# Patient Record
Sex: Male | Born: 1995 | Marital: Single | Smoking: Current every day smoker
Health system: Southern US, Community
[De-identification: ages and names within clinical notes are randomized; demographics above are authoritative.]

## PROBLEM LIST (undated history)

## (undated) DIAGNOSIS — K259 Gastric ulcer, unspecified as acute or chronic, without hemorrhage or perforation: Secondary | ICD-10-CM

## (undated) DIAGNOSIS — J45909 Unspecified asthma, uncomplicated: Secondary | ICD-10-CM

## (undated) DIAGNOSIS — Z5189 Encounter for other specified aftercare: Secondary | ICD-10-CM

## (undated) HISTORY — PX: HERNIA REPAIR: SHX51

---
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
## 2016-03-02 ENCOUNTER — Emergency Department (HOSPITAL_COMMUNITY): Payer: Medicaid Other

## 2016-03-02 ENCOUNTER — Emergency Department (HOSPITAL_COMMUNITY)
Admission: EM | Admit: 2016-03-02 | Discharge: 2016-03-02 | Disposition: A | Discharge status: Home / Self Care | Payer: Medicaid Other | Attending: Emergency Medicine | Admitting: Emergency Medicine

## 2016-03-02 ENCOUNTER — Encounter (HOSPITAL_COMMUNITY): Payer: Self-pay | Admitting: *Deleted

## 2016-03-02 DIAGNOSIS — Y9351 Activity, roller skating (inline) and skateboarding: Secondary | ICD-10-CM | POA: Insufficient documentation

## 2016-03-02 DIAGNOSIS — S01511A Laceration without foreign body of lip, initial encounter: Secondary | ICD-10-CM | POA: Insufficient documentation

## 2016-03-02 DIAGNOSIS — Y9289 Other specified places as the place of occurrence of the external cause: Secondary | ICD-10-CM | POA: Insufficient documentation

## 2016-03-02 DIAGNOSIS — S01512A Laceration without foreign body of oral cavity, initial encounter: Secondary | ICD-10-CM

## 2016-03-02 DIAGNOSIS — F17210 Nicotine dependence, cigarettes, uncomplicated: Secondary | ICD-10-CM | POA: Insufficient documentation

## 2016-03-02 DIAGNOSIS — K08419 Partial loss of teeth due to trauma, unspecified class: Secondary | ICD-10-CM

## 2016-03-02 DIAGNOSIS — Z23 Encounter for immunization: Secondary | ICD-10-CM | POA: Insufficient documentation

## 2016-03-02 DIAGNOSIS — W19XXXA Unspecified fall, initial encounter: Secondary | ICD-10-CM

## 2016-03-02 DIAGNOSIS — Y999 Unspecified external cause status: Secondary | ICD-10-CM | POA: Insufficient documentation

## 2016-03-02 DIAGNOSIS — S161XXA Strain of muscle, fascia and tendon at neck level, initial encounter: Secondary | ICD-10-CM | POA: Insufficient documentation

## 2016-03-02 DIAGNOSIS — K08409 Partial loss of teeth, unspecified cause, unspecified class: Secondary | ICD-10-CM | POA: Insufficient documentation

## 2016-03-02 DIAGNOSIS — S0181XA Laceration without foreign body of other part of head, initial encounter: Secondary | ICD-10-CM

## 2016-03-02 DIAGNOSIS — J45909 Unspecified asthma, uncomplicated: Secondary | ICD-10-CM | POA: Insufficient documentation

## 2016-03-02 MED ORDER — HYDROCODONE-ACETAMINOPHEN 5-325 MG PO TABS: 1.0000 | ORAL_TABLET | Freq: Four times a day (QID) | ORAL | 0 refills | Status: AC | PRN

## 2016-03-02 MED ORDER — HYDROCODONE-ACETAMINOPHEN 5-325 MG PO TABS
1.0000 | ORAL_TABLET | Freq: Once | ORAL | Status: AC
  Administered 2016-03-02: 1 via ORAL
  Filled 2016-03-02: qty 1

## 2016-03-02 MED ORDER — TETANUS-DIPHTH-ACELL PERTUSSIS 5-2.5-18.5 LF-MCG/0.5 IM SUSP
0.5000 mL | Freq: Once | INTRAMUSCULAR | Status: AC
  Administered 2016-03-02: 0.5 mL via INTRAMUSCULAR
  Filled 2016-03-02: qty 0.5

## 2016-03-02 MED ORDER — LIDOCAINE HCL (PF) 1 % IJ SOLN
5.0000 mL | Freq: Once | INTRAMUSCULAR | Status: AC
  Administered 2016-03-02: 5 mL
  Filled 2016-03-02: qty 5

## 2016-03-02 NOTE — ED Triage Notes (Signed)
Pt reports R front tooth being knocked out today after skateboarding accident, pt has two 1 cm lacs to upper lip, ambulatory, denies LOC

## 2016-03-02 NOTE — Discharge Instructions (Signed)
Call Dr. Leanord AsalFarless in the morning and schedule follow up. Do not drive while taking the narcotic as it will make you sleepy. Continue to take the ibuprofen.  The sutures inside the mouth will dissolve. You will need to have the sutures taken out of the outside of the lip in 3 to 5 days. Return sooner for any problems.

## 2016-03-02 NOTE — ED Provider Notes (Signed)
[facility]-EMERGENCY DEPT Provider Note   CSN: [id number] Arrival date & time: 03/02/16  1620  By signing my name below, I, Nathan Roberson, attest that this documentation has been prepared under the direction and in the presence of Kerrie BuffaloHope Aquilla Shambley, NP. Electronically Signed: Alyssa GroveMartin Roberson, ED Scribe. 03/02/16. 5:28 PM.  History   Chief Complaint Chief Complaint  Patient presents with  . Facial Injury   The history is provided by the patient. No language interpreter was used.    HPI Comments: Nathan Roberson is a 21 y.o. male who presents to the Emergency Department complaining of a dental injury and facial laceration s/p skateboarding accident at 3 PM. He attempted to jump down a set of stairs and fell, striking his face on the skateboard and stair railing. Pt is missing a right sided incisor. He denies LOC. Pt reports associated mild neck pain. He has tried 800 mg Ibuprofen with no significant relief. He is unaware of his last tetanus vaccination. Denies nosebleeds, visual changes, bleeding from ears, or any other complaints at this time.   Past Medical History:  Diagnosis Date  . Asthma   . Gastric ulcer     There are no active problems to display for this patient.   Past Surgical History:  Procedure Laterality Date  . HERNIA REPAIR         Home Medications    Prior to Admission medications   Medication Sig Start Date End Date Taking? Authorizing Provider  HYDROcodone-acetaminophen (NORCO) 5-325 MG tablet Take 1 tablet by mouth every 6 (six) hours as needed. 03/02/16   Donelle Hise Orlene OchM Melquan Ernsberger, NP  traMADol (ULTRAM) 50 MG tablet Take 1-2 tablets (50-100 mg total) by mouth every 6 (six) hours as needed. 04/25/14   Renne CriglerJoshua Geiple, PA-C    Family History No family history on file.  Social History Social History  Substance Use Topics  . Smoking status: Current Every Day Smoker    Packs/day: 0.75    Types: Cigarettes  . Smokeless tobacco: Never Used  . Alcohol use No     Comment: OCCASSIONAL      Allergies   Pineapple   Review of Systems Review of Systems  HENT: Positive for dental problem. Negative for ear discharge and nosebleeds.        +Facial Lacerations  Eyes: Negative for visual disturbance.  Gastrointestinal: Negative for nausea and vomiting.  Musculoskeletal: Positive for neck pain. Negative for back pain.  Skin: Positive for wound.  Neurological: Negative for syncope and headaches.  Psychiatric/Behavioral: Negative for confusion.     Physical Exam Updated Vital Signs BP (!) 112/46 (BP Location: Right Arm)   Pulse 73   Temp 99.2 F (37.3 C) (Oral)   Resp 17   Ht 5\' 7"  (1.702 m)   Wt 76.2 kg   SpO2 99%   BMI 26.31 kg/m   Physical Exam  Constitutional: He is oriented to person, place, and time. He appears well-developed and well-nourished. He is active. No distress.  HENT:  Head: Head is with laceration.    Right Ear: Tympanic membrane normal.  Left Ear: Tympanic membrane normal.  Uvula is midline, no edema or erythema There are 2 lacerations to the upper lip which do not involve the vermillion border Each laceration is 1 cm Pt is missing the right upper central incisor   Eyes: Conjunctivae and EOM are normal. Pupils are equal, round, and reactive to light. No scleral icterus.  Neck: Trachea normal. Spinous process tenderness and muscular tenderness (left) present.  Decreased range of motion: due to pain.  Tenderness over the cervical spine  Cardiovascular: Normal rate and regular rhythm.   Pulmonary/Chest: Effort normal. No respiratory distress.  Musculoskeletal: Normal range of motion. He exhibits tenderness.  Neurological: He is alert and oriented to person, place, and time.  Skin: Skin is warm and dry.  Psychiatric: He has a normal mood and affect. His behavior is normal.  Nursing note and vitals reviewed.   ED Treatments / Results  DIAGNOSTIC STUDIES: Oxygen Saturation is 99% on RA, normal by my interpretation.    COORDINATION OF  CARE: 5:20 PM Discussed treatment plan with pt at bedside which includes Tdap injection and CT Cervical Spine and pt agreed to plan.  Labs (all labs ordered are listed, but only abnormal results are displayed) Labs Reviewed - No data to display   Radiology Ct Cervical Spine Wo Contrast  Result Date: 03/02/2016 CLINICAL DATA:  21 y/o  M; neck pain after fall. EXAM: CT CERVICAL SPINE WITHOUT CONTRAST TECHNIQUE: Multidetector CT imaging of the cervical spine was performed without intravenous contrast. Multiplanar CT image reconstructions were also generated. COMPARISON:  None. FINDINGS: Alignment: Normal. Skull base and vertebrae: No acute fracture. No primary bone lesion or focal pathologic process. Soft tissues and spinal canal: No prevertebral fluid or swelling. No visible canal hematoma. Disc levels:  No significant cervical degenerative changes. Upper chest: Negative. Other: Thin sigmoid plate of right jugular bulb, borderline high-riding. IMPRESSION: No acute fracture or dislocation.  Normal CT of the cervical spine. Electronically Signed   By: Mitzi Hansen M.D.   On: 03/02/2016 18:16    Procedures .Marland KitchenLaceration Repair Date/Time: 03/02/2016 6:33 PM Performed by: Janne Napoleon Authorized by: Janne Napoleon   Consent:    Consent obtained:  Verbal   Consent given by:  Patient   Risks discussed:  Pain, infection and poor cosmetic result   Alternatives discussed:  No treatment Anesthesia (see MAR for exact dosages):    Anesthesia method:  Local infiltration   Local anesthetic:  Lidocaine 1% w/o epi Laceration details:    Location:  Lip   Lip location:  Upper exterior lip   Length (cm):  1 Repair type:    Repair type:  Simple Pre-procedure details:    Preparation:  Patient was prepped and draped in usual sterile fashion Exploration:    Wound exploration: entire depth of wound probed and visualized     Contaminated: no   Treatment:    Area cleansed with:  Saline   Amount  of cleaning:  Standard   Irrigation solution:  Sterile saline   Irrigation method:  Syringe Skin repair:    Repair method:  Sutures   Suture size:  6-0   Suture material:  Prolene   Suture technique:  Simple interrupted   Number of sutures:  2 Approximation:    Approximation:  Close Post-procedure details:    Dressing:  Open (no dressing)   Patient tolerance of procedure:  Tolerated well, no immediate complications Comments:     This was a through and through laceration of the upper lip.  Inside the upper lip wound closed with 6-0 Vicryl  X 3 sutures.    (including critical care time)  Medications Ordered in ED Medications  Tdap (BOOSTRIX) injection 0.5 mL (0.5 mLs Intramuscular Given 03/02/16 1734)  lidocaine (PF) (XYLOCAINE) 1 % injection 5 mL (5 mLs Infiltration Given by Other 03/02/16 1735)  HYDROcodone-acetaminophen (NORCO/VICODIN) 5-325 MG per tablet 1 tablet (1 tablet Oral Given  03/02/16 1903)     Initial Impression / Assessment and Plan / ED Course  I have reviewed the triage vital signs and the nursing notes.  Pertinent labs & imaging results that were available during my care of the patient were reviewed by me and considered in my medical decision making (see chart for details).    I personally performed the services described in this documentation, which was scribed in my presence. The recorded information has been reviewed and is accurate.   Final Clinical Impressions(s) / ED Diagnoses  21 y.o. male with dental injury and facial wounds s/p fall stable for d/c with normal CT of neck. Patient given referral to dentist. Discussed wound care and suture removal in 3 to 5 days . Patient will return here sooner for any problems.  Final diagnoses:  Facial laceration, initial encounter  Laceration of mouth, initial encounter  Cervical strain, acute, initial encounter  Knocked out tooth, unspecified edentulism class  Fall, initial encounter    New  Prescriptions Discharge Medication List as of 03/02/2016  7:03 PM    START taking these medications   Details  HYDROcodone-acetaminophen (NORCO) 5-325 MG tablet Take 1 tablet by mouth every 6 (six) hours as needed., Starting Wed 03/02/2016, Print         Severy, NP 03/03/16 1610    Arby Barrette, MD 03/03/16 [phone]

## 2016-03-02 NOTE — ED Notes (Signed)
Pt stable, ambulatory, states understanding of discharge instructions 

## 2016-03-09 ENCOUNTER — Emergency Department (HOSPITAL_COMMUNITY)
Admission: EM | Admit: 2016-03-09 | Discharge: 2016-03-09 | Disposition: A | Discharge status: Home / Self Care | Payer: Medicaid Other | Attending: Emergency Medicine | Admitting: Emergency Medicine

## 2016-03-09 ENCOUNTER — Encounter (HOSPITAL_COMMUNITY): Payer: Self-pay | Admitting: *Deleted

## 2016-03-09 DIAGNOSIS — Z4802 Encounter for removal of sutures: Secondary | ICD-10-CM | POA: Insufficient documentation

## 2016-03-09 DIAGNOSIS — F17210 Nicotine dependence, cigarettes, uncomplicated: Secondary | ICD-10-CM | POA: Insufficient documentation

## 2016-03-09 DIAGNOSIS — Z79899 Other long term (current) drug therapy: Secondary | ICD-10-CM | POA: Insufficient documentation

## 2016-03-09 DIAGNOSIS — J45909 Unspecified asthma, uncomplicated: Secondary | ICD-10-CM | POA: Insufficient documentation

## 2016-03-09 NOTE — ED Provider Notes (Signed)
[facility]-EMERGENCY DEPT Provider Note   CSN: [id number] Arrival date & time: 03/09/16  2104  By signing my name below, I, Nathan Roberson, attest that this documentation has been prepared under the direction and in the presence of Nathan Roberson, [place]. Electronically Signed: Teofilo PodMatthew P. Roberson, ED Scribe. 03/09/2016. 10:24 PM.    History   Chief Complaint Chief Complaint  Patient presents with  . Suture / Staple Removal    The history is provided by the patient. No language interpreter was used.   HPI Comments:  Nathan Roberson is a 21 y.o. male who presents to the Emergency Department, here for a suture removal after having 2 sutures placed to his upper lip 7 days ago. Pt was seen here on 03/02/16 after sustaining a laceration to his upper lip after striking his upper lip on a skateboard and a railing. Pt denies any drainage or sign of infection. No alleviating factors noted. Pt denies other associated symptoms.   Past Medical History:  Diagnosis Date  . Asthma   . Gastric ulcer     There are no active problems to display for this patient.   Past Surgical History:  Procedure Laterality Date  . HERNIA REPAIR         Home Medications    Prior to Admission medications   Medication Sig Start Date End Date Taking? Authorizing Provider  HYDROcodone-acetaminophen (NORCO) 5-325 MG tablet Take 1 tablet by mouth every 6 (six) hours as needed. 03/02/16   Hope Orlene OchM Neese, NP  traMADol (ULTRAM) 50 MG tablet Take 1-2 tablets (50-100 mg total) by mouth every 6 (six) hours as needed. 04/25/14   Renne CriglerJoshua Geiple, PA-C    Family History No family history on file.  Social History Social History  Substance Use Topics  . Smoking status: Current Every Day Smoker    Packs/day: 0.75    Types: Cigarettes  . Smokeless tobacco: Never Used  . Alcohol use Yes     Comment: OCCASSIONAL     Allergies   Pineapple   Review of Systems Review of Systems  Constitutional: Negative for chills  and fever.  HENT: Negative for ear pain and sore throat.   Eyes: Negative for pain and visual disturbance.  Respiratory: Negative for cough and shortness of breath.   Cardiovascular: Negative for chest pain and palpitations.  Gastrointestinal: Negative for abdominal pain and vomiting.  Genitourinary: Negative for dysuria and hematuria.  Musculoskeletal: Negative for arthralgias and back pain.  Skin: Positive for wound. Negative for color change and rash.  Neurological: Negative for seizures and syncope.     Physical Exam Updated Vital Signs BP (!) 136/52 (BP Location: Right Arm)   Pulse 65   Temp 98.5 F (36.9 C) (Oral)   Resp 18   SpO2 99%   Physical Exam  Constitutional: He appears well-developed and well-nourished. No distress.  Patient is afebrile, non-toxic appearing, seating comfortably in chair in no acute distress.   HENT:  Head: Normocephalic and atraumatic.  Eyes: Conjunctivae are normal.  Cardiovascular: Normal rate.   Pulmonary/Chest: Effort normal.  Abdominal: He exhibits no distension.  Neurological: He is alert.  Skin: Skin is warm and dry.  Psychiatric: He has a normal mood and affect.  Nursing note and vitals reviewed.    ED Treatments / Results  DIAGNOSTIC STUDIES:  Oxygen Saturation is 96% on RA, normal by my interpretation.    COORDINATION OF CARE:  10:24 PM Will remove sutures. Discussed treatment plan with pt at bedside and  pt agreed to plan.   Labs (all labs ordered are listed, but only abnormal results are displayed) Labs Reviewed - No data to display  EKG  EKG Interpretation None       Radiology No results found.  Procedures Procedures (including critical care time)  SUTURE REMOVAL Performed by: Georgiana Shore  Consent: Verbal consent obtained. Patient identity confirmed: provided demographic data Time out: Immediately prior to procedure a "time out" was called to verify the correct patient, procedure, equipment,  support staff and site/side marked as required.  Location details: above upper lip  Wound Appearance: clean  Sutures/Staples Removed: 2  Facility: sutures placed in this facility Patient tolerance: Patient tolerated the procedure well with no immediate complications.    Medications Ordered in ED Medications - No data to display   Initial Impression / Assessment and Plan / ED Course  I have reviewed the triage vital signs and the nursing notes.  Pertinent labs & imaging results that were available during my care of the patient were reviewed by me and considered in my medical decision making (see chart for details).     Otherwise healthy 21 year old male presenting for suture removal. 2 sutures just above the upper lip placed 7 days ago. Patient was advised to return in 3-5 days later to have them removed. He denies any complications. Well-healing lacerations, no erythema, swelling, or purulent discharge. He is otherwise well-appearing, nontoxic and afebrile.  Sutures were removed without any complications. Discharge home with PCP and dentist follow-up. He had been told to be seen by a dentist at his last visit and hasn't made it because he didn't have a ride. Urged him to follow-up and provided him with resources in the area for a dentist and her primary care provider. States that he will be making an appointment and be seen.  Discussed strict return precautions. Patient was advised to return to the emergency department if experiencing any new or worsening symptoms. Patient clearly understood instructions and agreed with discharge plan.  Final Clinical Impressions(s) / ED Diagnoses   Final diagnoses:  Visit for suture removal    New Prescriptions New Prescriptions   No medications on file  I personally performed the services described in this documentation, which was scribed in my presence. The recorded information has been reviewed and is accurate.     Georgiana Shore, PA-C 03/09/16 2255    Marily Memos, MD 03/09/16 [phone]

## 2016-03-09 NOTE — ED Triage Notes (Signed)
Pt had sutures placed in top lip 5 days ago, reports some sutures were dissolvable. Requesting removal of remaining stitches
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
## 2016-08-06 ENCOUNTER — Inpatient Hospital Stay (HOSPITAL_COMMUNITY)
Admission: EM | Admit: 2016-08-06 | Discharge: 2016-08-07 | DRG: 605 | Disposition: A | Discharge status: Home / Self Care | Payer: Self-pay | Attending: Surgery | Admitting: Surgery

## 2016-08-06 ENCOUNTER — Emergency Department (HOSPITAL_COMMUNITY): Payer: Self-pay

## 2016-08-06 ENCOUNTER — Encounter (HOSPITAL_COMMUNITY): Payer: Self-pay

## 2016-08-06 DIAGNOSIS — F1010 Alcohol abuse, uncomplicated: Secondary | ICD-10-CM | POA: Diagnosis present

## 2016-08-06 DIAGNOSIS — S31119A Laceration without foreign body of abdominal wall, unspecified quadrant without penetration into peritoneal cavity, initial encounter: Secondary | ICD-10-CM

## 2016-08-06 DIAGNOSIS — Z7289 Other problems related to lifestyle: Secondary | ICD-10-CM

## 2016-08-06 DIAGNOSIS — T1491XA Suicide attempt, initial encounter: Secondary | ICD-10-CM | POA: Diagnosis present

## 2016-08-06 DIAGNOSIS — S31112A Laceration without foreign body of abdominal wall, epigastric region without penetration into peritoneal cavity, initial encounter: Principal | ICD-10-CM | POA: Diagnosis present

## 2016-08-06 DIAGNOSIS — S51812A Laceration without foreign body of left forearm, initial encounter: Secondary | ICD-10-CM | POA: Diagnosis present

## 2016-08-06 DIAGNOSIS — F4325 Adjustment disorder with mixed disturbance of emotions and conduct: Secondary | ICD-10-CM | POA: Diagnosis present

## 2016-08-06 DIAGNOSIS — F1910 Other psychoactive substance abuse, uncomplicated: Secondary | ICD-10-CM

## 2016-08-06 DIAGNOSIS — F329 Major depressive disorder, single episode, unspecified: Secondary | ICD-10-CM | POA: Diagnosis present

## 2016-08-06 DIAGNOSIS — IMO0002 Reserved for concepts with insufficient information to code with codable children: Secondary | ICD-10-CM

## 2016-08-06 DIAGNOSIS — F4310 Post-traumatic stress disorder, unspecified: Secondary | ICD-10-CM | POA: Diagnosis present

## 2016-08-06 DIAGNOSIS — X781XXA Intentional self-harm by knife, initial encounter: Secondary | ICD-10-CM | POA: Diagnosis present

## 2016-08-06 HISTORY — DX: Encounter for other specified aftercare: Z51.89

## 2016-08-06 LAB — PREPARE FRESH FROZEN PLASMA
UNIT DIVISION: 0
Unit division: 0

## 2016-08-06 LAB — I-STAT CG4 LACTIC ACID, ED: Lactic Acid, Venous: 2.21 mmol/L (ref 0.5–1.9)

## 2016-08-06 LAB — TYPE AND SCREEN
ABO/RH(D): O POS
Antibody Screen: NEGATIVE
Unit division: 0
Unit division: 0

## 2016-08-06 LAB — I-STAT CHEM 8, ED
BUN: 20 mg/dL (ref 6–20)
Calcium, Ion: 1 mmol/L — ABNORMAL LOW (ref 1.15–1.40)
Chloride: 104 mmol/L (ref 101–111)
Creatinine, Ser: 1.2 mg/dL (ref 0.61–1.24)
Glucose, Bld: 102 mg/dL — ABNORMAL HIGH (ref 65–99)
HCT: 42 % (ref 39.0–52.0)
Hemoglobin: 14.3 g/dL (ref 13.0–17.0)
Potassium: 3.5 mmol/L (ref 3.5–5.1)
Sodium: 139 mmol/L (ref 135–145)
TCO2: 23 mmol/L (ref 0–100)

## 2016-08-06 LAB — BPAM FFP
BLOOD PRODUCT EXPIRATION DATE: 201808012359
Blood Product Expiration Date: 201808012359
ISSUE DATE / TIME: 201807280312
ISSUE DATE / TIME: 201807280312
Unit Type and Rh: 6200
Unit Type and Rh: 6200

## 2016-08-06 LAB — CBC
HCT: 39.9 % (ref 39.0–52.0)
Hemoglobin: 14.2 g/dL (ref 13.0–17.0)
MCH: 29.9 pg (ref 26.0–34.0)
MCHC: 35.6 g/dL (ref 30.0–36.0)
MCV: 84 fL (ref 78.0–100.0)
Platelets: 265 10*3/uL (ref 150–400)
RBC: 4.75 MIL/uL (ref 4.22–5.81)
RDW: 11.8 % (ref 11.5–15.5)
WBC: 9.2 10*3/uL (ref 4.0–10.5)

## 2016-08-06 LAB — URINALYSIS, ROUTINE W REFLEX MICROSCOPIC
Bilirubin Urine: NEGATIVE
GLUCOSE, UA: NEGATIVE mg/dL
Hgb urine dipstick: NEGATIVE
Ketones, ur: NEGATIVE mg/dL
LEUKOCYTES UA: NEGATIVE
Nitrite: NEGATIVE
PROTEIN: NEGATIVE mg/dL
Specific Gravity, Urine: 1.042 — ABNORMAL HIGH (ref 1.005–1.030)
pH: 5 (ref 5.0–8.0)

## 2016-08-06 LAB — COMPREHENSIVE METABOLIC PANEL
ALT: 26 U/L (ref 17–63)
ANION GAP: 12 (ref 5–15)
AST: 43 U/L — ABNORMAL HIGH (ref 15–41)
Albumin: 4.4 g/dL (ref 3.5–5.0)
Alkaline Phosphatase: 88 U/L (ref 38–126)
BUN: 16 mg/dL (ref 6–20)
CHLORIDE: 105 mmol/L (ref 101–111)
CO2: 20 mmol/L — AB (ref 22–32)
Calcium: 8.9 mg/dL (ref 8.9–10.3)
Creatinine, Ser: 1.2 mg/dL (ref 0.61–1.24)
GFR calc non Af Amer: >60 mL/min (ref >60)
Glucose, Bld: 101 mg/dL — ABNORMAL HIGH (ref 65–99)
POTASSIUM: 3.4 mmol/L — AB (ref 3.5–5.1)
SODIUM: 137 mmol/L (ref 135–145)
Total Bilirubin: 0.7 mg/dL (ref 0.3–1.2)
Total Protein: 7.8 g/dL (ref 6.5–8.1)

## 2016-08-06 LAB — BPAM RBC
Blood Product Expiration Date: 201807312359
Blood Product Expiration Date: 201807312359
ISSUE DATE / TIME: 201807280748
ISSUE DATE / TIME: 201807280748
Unit Type and Rh: 9500
Unit Type and Rh: 9500

## 2016-08-06 LAB — HIV ANTIBODY (ROUTINE TESTING W REFLEX): HIV Screen 4th Generation wRfx: NONREACTIVE

## 2016-08-06 LAB — SALICYLATE LEVEL: Salicylate Lvl: <7 mg/dL (ref 2.8–30.0)

## 2016-08-06 LAB — ACETAMINOPHEN LEVEL

## 2016-08-06 LAB — ABO/RH: ABO/RH(D): O POS

## 2016-08-06 LAB — PROTIME-INR
INR: 1.16
PROTHROMBIN TIME: 14.9 s (ref 11.4–15.2)

## 2016-08-06 LAB — CDS SEROLOGY

## 2016-08-06 LAB — ETHANOL: Alcohol, Ethyl (B): 125 mg/dL — ABNORMAL HIGH (ref <5)

## 2016-08-06 MED ORDER — ONDANSETRON 4 MG PO TBDP: 4.0000 mg | ORAL_TABLET | Freq: Four times a day (QID) | ORAL | Status: DC | PRN

## 2016-08-06 MED ORDER — IOPAMIDOL (ISOVUE-300) INJECTION 61%
100.0000 mL | Freq: Once | INTRAVENOUS | Status: AC | PRN
  Administered 2016-08-06: 100 mL via INTRAVENOUS

## 2016-08-06 MED ORDER — ONDANSETRON HCL 4 MG/2ML IJ SOLN
INTRAMUSCULAR | Status: AC
  Filled 2016-08-06: qty 2

## 2016-08-06 MED ORDER — ACETAMINOPHEN 325 MG PO TABS
650.0000 mg | ORAL_TABLET | Freq: Four times a day (QID) | ORAL | Status: DC | PRN
  Administered 2016-08-07: 650 mg via ORAL
  Filled 2016-08-06: qty 2

## 2016-08-06 MED ORDER — WHITE PETROLATUM GEL
Status: AC
  Administered 2016-08-06: 13:00:00
  Filled 2016-08-06: qty 1

## 2016-08-06 MED ORDER — MORPHINE SULFATE (PF) 4 MG/ML IV SOLN
2.0000 mg | INTRAVENOUS | Status: DC | PRN
  Administered 2016-08-06: 2 mg via INTRAVENOUS
  Filled 2016-08-06: qty 1

## 2016-08-06 MED ORDER — IBUPROFEN 400 MG PO TABS
400.0000 mg | ORAL_TABLET | ORAL | Status: DC | PRN
  Administered 2016-08-06: 400 mg via ORAL
  Filled 2016-08-06 (×2): qty 1

## 2016-08-06 MED ORDER — BACITRACIN ZINC 500 UNIT/GM EX OINT
TOPICAL_OINTMENT | Freq: Every day | CUTANEOUS | Status: DC
  Administered 2016-08-06: 11:00:00 via TOPICAL
  Filled 2016-08-06: qty 28.35

## 2016-08-06 MED ORDER — TETANUS-DIPHTH-ACELL PERTUSSIS 5-2.5-18.5 LF-MCG/0.5 IM SUSP
0.5000 mL | Freq: Once | INTRAMUSCULAR | Status: AC
  Administered 2016-08-06: 0.5 mL via INTRAMUSCULAR

## 2016-08-06 MED ORDER — ONDANSETRON HCL 4 MG/2ML IJ SOLN
4.0000 mg | Freq: Four times a day (QID) | INTRAMUSCULAR | Status: DC | PRN
  Administered 2016-08-06: 4 mg via INTRAVENOUS

## 2016-08-06 MED ORDER — SODIUM CHLORIDE 0.9 % IV SOLN
INTRAVENOUS | Status: DC
  Administered 2016-08-06: 05:00:00 via INTRAVENOUS

## 2016-08-06 MED ORDER — TETANUS-DIPHTH-ACELL PERTUSSIS 5-2.5-18.5 LF-MCG/0.5 IM SUSP
INTRAMUSCULAR | Status: AC
  Filled 2016-08-06: qty 0.5
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
## 2016-08-06 NOTE — ED Notes (Signed)
Attempted report 

## 2016-08-06 NOTE — Progress Notes (Signed)
Responded to Level 1 Trauma page. Learned that gf Desiree was in the waiting room. Practiced ministry of presence and hospitality. When given the all clear, brought gf to be with pt.
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
## 2016-08-06 NOTE — ED Notes (Signed)
Staffing called for sitter.   
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
## 2016-08-06 NOTE — ED Notes (Signed)
Family at beside. Family given emotional support. 
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
## 2016-08-06 NOTE — Progress Notes (Signed)
The only need this patient has for inpatient care is psychiatry evaluation.  His injuries do not rrequire further hospitalization, but he cannot be release without psychiatric evaluation.  Marta LamasJames O. Gae BonWyatt, III, MD, FACS [phone] Trauma Surgeon

## 2016-08-07 MED ORDER — BACITRACIN ZINC 500 UNIT/GM EX OINT: TOPICAL_OINTMENT | CUTANEOUS | 0 refills | Status: AC

## 2016-08-07 MED ORDER — IBUPROFEN 800 MG PO TABS: ORAL_TABLET | ORAL | 0 refills | Status: AC

## 2016-08-07 MED ORDER — ACETAMINOPHEN 325 MG PO TABS
650.0000 mg | ORAL_TABLET | Freq: Four times a day (QID) | ORAL | Status: AC | PRN
Start: 2016-08-07 — End: ?
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
## 2016-08-07 NOTE — Clinical Social Work Note (Signed)
CSW provided list of outpatient counseling and psychiatry. Clinical Social Worker will sign off for now as social work intervention is no longer needed. Please consult us again if new need arises.   WheelerBridget Purcell [place], [place] [phone]
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
## 2016-08-07 NOTE — Progress Notes (Addendum)
Went in to check on pt and ask if he was ready for discharge paperwork.  Upon entering room it appeared that pt had left as no one was in the room and no belongings.  RN had not gone over discharge paperwork or removed pt's saline lock.  Attempted to reach pt via phone with no success.  Will try to call again. PA paged.  Hector ShadeMoss, Lavelle Akel [place]
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
## 2016-08-07 NOTE — Discharge Instructions (Signed)
Laceration Care, Adult A laceration is a cut that goes through all of the layers of the skin and into the tissue that is right under the skin. Some lacerations heal on their own. Others need to be closed with stitches (sutures), staples, skin adhesive strips, or skin glue. Proper laceration care minimizes the risk of infection and helps the laceration to heal better. How is this treated? If sutures or staples were used:  Keep the wound clean and dry.  If you were given a bandage (dressing), you should change it at least one time per day or as told by your health care provider. You should also change it if it becomes wet or dirty.  Keep the wound completely dry for the first 24 hours or as told by your health care provider. After that time, you may shower or bathe. However, make sure that the wound is not soaked in water until after the sutures or staples have been removed.  Clean the wound one time each day or as told by your health care provider: ? Wash the wound with soap and water. ? Rinse the wound with water to remove all soap. ? Pat the wound dry with a clean towel. Do not rub the wound.  After cleaning the wound, apply a thin layer of antibiotic ointmentas told by your health care provider. This will help to prevent infection and keep the dressing from sticking to the wound.  Have the sutures or staples removed as told by your health care provider. If skin adhesive strips were used:  Keep the wound clean and dry.  If you were given a bandage (dressing), you should change it at least one time per day or as told by your health care provider. You should also change it if it becomes dirty or wet.  Do not get the skin adhesive strips wet. You may shower or bathe, but be careful to keep the wound dry.  If the wound gets wet, pat it dry with a clean towel. Do not rub the wound.  Skin adhesive strips fall off on their own. You may trim the strips as the wound heals. Do not remove skin  adhesive strips that are still stuck to the wound. They will fall off in time. If skin glue was used:  Try to keep the wound dry, but you may briefly wet it in the shower or bath. Do not soak the wound in water, such as by swimming.  After you have showered or bathed, gently pat the wound dry with a clean towel. Do not rub the wound.  Do not do any activities that will make you sweat heavily until the skin glue has fallen off on its own.  Do not apply liquid, cream, or ointment medicine to the wound while the skin glue is in place. Using those may loosen the film before the wound has healed.  If you were given a bandage (dressing), you should change it at least one time per day or as told by your health care provider. You should also change it if it becomes dirty or wet.  If a dressing is placed over the wound, be careful not to apply tape directly over the skin glue. Doing that may cause the glue to be pulled off before the wound has healed.  Do not pick at the glue. The skin glue usually remains in place for 5-10 days, then it falls off of the skin. General Instructions  Take over-the-counter and prescription medicines only  as told by your health care provider.  If you were prescribed an antibiotic medicine or ointment, take or apply it as told by your doctor. Do not stop using it even if your condition improves.  To help prevent scarring, make sure to cover your wound with sunscreen whenever you are outside after stitches are removed, after adhesive strips are removed, or when glue remains in place and the wound is healed. Make sure to wear a sunscreen of at least 30 SPF.  Do not scratch or pick at the wound.  Keep all follow-up visits as told by your health care provider. This is important.  Check your wound every day for signs of infection. Watch for: ? Redness, swelling, or pain. ? Fluid, blood, or pus.  Raise (elevate) the injured area above the level of your heart while you  are sitting or lying down, if possible. Contact a health care provider if:  You received a tetanus shot and you have swelling, severe pain, redness, or bleeding at the injection site.  You have a fever.  A wound that was closed breaks open.  You notice a bad smell coming from your wound or your dressing.  You notice something coming out of the wound, such as wood or glass.  Your pain is not controlled with medicine.  You have increased redness, swelling, or pain at the site of your wound.  You have fluid, blood, or pus coming from your wound.  You notice a change in the color of your skin near your wound.  You need to change the dressing frequently due to fluid, blood, or pus draining from the wound.  You develop a new rash.  You develop numbness around the wound. Get help right away if:  You develop severe swelling around the wound.  Your pain suddenly increases and is severe.  You develop painful lumps near the wound or on skin that is anywhere on your body.  You have a red streak going away from your wound.  The wound is on your hand or foot and you cannot properly move a finger or toe.  The wound is on your hand or foot and you notice that your fingers or toes look pale or bluish. This information is not intended to replace advice given to you by your health care provider. Make sure you discuss any questions you have with your health care provider. Document Released: 12/27/2004 Document Revised: 05/29/2015 Document Reviewed: 12/23/2013 Elsevier Interactive Patient Education  2017 [employer]   Adjustment Disorder, Adult Adjustment disorder is a group of symptoms that can develop after a stressful life event, such as the loss of a job or serious physical illness. The symptoms can affect how you feel, think, and act. They may interfere with your relationships. Adjustment disorder increases your risk of suicide and substance abuse. If this disorder is not managed  early, it can develop into a more serious condition, such as major depressive disorder or post-traumatic stress disorder. What are the causes? This condition happens when you have trouble recovering from or coping with a stressful life event. What increases the risk? You are more likely to develop this condition if:  You have had depression or anxiety.  You are being treated for a long-term (chronic) illness.  You are being treated for an illness that cannot be cured (terminal illness).  You have a family history of mental illness.  What are the signs or symptoms? Symptoms of this condition include:  Extreme trouble doing  daily tasks, such as going to work.  Sadness, depression, or crying spells.  Worrying a lot.  Loss of enjoyment.  Change in appetite or weight.  Feelings of loss or hopelessness.  Thoughts of suicide.  Anxiety, worry, or nervousness.  Trouble sleeping.  Avoiding family and friends.  Fighting or vandalism.  Complaining of feeling sick without being ill.  Feeling dazed or disconnected.  Nightmares.  Trouble sleeping.  Irritability.  Reckless driving.  Poor work International aid/development worker.  Ignoring bills.  Symptoms of this condition start within three months of the stressful event. They do not last more than six months, unless the stressful circumstances last longer. Normal grieving after the death of a loved one is not a symptom of this condition. How is this diagnosed? To diagnose this condition, your health care provider will ask about what has happened in your life and how it has affected you. He or she may also ask about your medical history and your use of medicines, alcohol, and other substances. Your health care provider may do a physical exam and order lab tests or other studies. You may be referred to a mental health specialist. How is this treated? Treatment options for this condition include:  Counseling or talk therapy. Talk therapy is  usually provided by mental health specialists.  Medicines. Certain medicines may help with depression, anxiety, and sleep.  Support groups. These offer emotional support, advice, and guidance. They are made up of people who have had similar experiences.  Observation and time. This is sometimes called "watchful waiting." In this treatment, health care providers monitor your health and behavior without other treatment. Adjustment disorder sometimes gets better on its own with time.  Follow these instructions at home:  Take over-the-counter and prescription medicines only as told by your health care provider.  Keep all follow-up visits as told by your health care provider. This is important. Contact a health care provider if:  Your symptoms do not improve in six months.  Your symptoms get worse. Get help right away if:  You have serious thoughts about hurting yourself or someone else. If you ever feel like you may hurt yourself or others, or have thoughts about taking your own life, get help right away. You can go to your nearest emergency department or call:  Your local emergency services (911 in the [place]  A suicide crisis helpline, such as the National Suicide Prevention Lifeline at (561)397-0658. This is open 24 hours a day.  Summary  Adjustment disorder is a group of symptoms that can develop after a stressful life event, such as the loss of a job or serious physical illness. The symptoms can affect how you feel, think, and act. They may interfere with your relationships.  Symptoms of this condition start within three months of the stressful event. They do not last more than six months, unless the stressful circumstances last longer.  Treatment may include talk therapy, medicines, participation in a support group, or observation to see if symptoms improve.  Contact your health care provider if your symptoms get worse or do not improve in six months.  If you ever feel like you  may hurt yourself or others, or have thoughts about taking your own life, get help right away. This information is not intended to replace advice given to you by your health care provider. Make sure you discuss any questions you have with your health care provider. Document Released: 08/31/2005 Document Revised: 02/26/2016 Document Reviewed: 02/26/2016 Elsevier Interactive Patient Education  2018 [employer].

## 2016-08-08 ENCOUNTER — Encounter (HOSPITAL_COMMUNITY): Payer: Self-pay | Admitting: *Deleted
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
## 2017-08-16 ENCOUNTER — Emergency Department (HOSPITAL_COMMUNITY)
Admission: EM | Admit: 2017-08-16 | Discharge: 2017-08-16 | Disposition: A | Discharge status: Home / Self Care | Payer: Self-pay | Attending: Emergency Medicine | Admitting: Emergency Medicine

## 2017-08-16 ENCOUNTER — Other Ambulatory Visit: Payer: Self-pay

## 2017-08-16 ENCOUNTER — Emergency Department (HOSPITAL_COMMUNITY): Payer: Self-pay

## 2017-08-16 ENCOUNTER — Encounter (HOSPITAL_COMMUNITY): Payer: Self-pay | Admitting: *Deleted

## 2017-08-16 DIAGNOSIS — F17210 Nicotine dependence, cigarettes, uncomplicated: Secondary | ICD-10-CM | POA: Insufficient documentation

## 2017-08-16 DIAGNOSIS — S62352A Nondisplaced fracture of shaft of third metacarpal bone, right hand, initial encounter for closed fracture: Secondary | ICD-10-CM | POA: Insufficient documentation

## 2017-08-16 DIAGNOSIS — Y929 Unspecified place or not applicable: Secondary | ICD-10-CM | POA: Insufficient documentation

## 2017-08-16 DIAGNOSIS — Y22XXXA Handgun discharge, undetermined intent, initial encounter: Secondary | ICD-10-CM | POA: Insufficient documentation

## 2017-08-16 DIAGNOSIS — R05 Cough: Secondary | ICD-10-CM | POA: Insufficient documentation

## 2017-08-16 DIAGNOSIS — Y999 Unspecified external cause status: Secondary | ICD-10-CM | POA: Insufficient documentation

## 2017-08-16 DIAGNOSIS — Y939 Activity, unspecified: Secondary | ICD-10-CM | POA: Insufficient documentation

## 2017-08-16 DIAGNOSIS — J45909 Unspecified asthma, uncomplicated: Secondary | ICD-10-CM | POA: Insufficient documentation

## 2017-08-16 DIAGNOSIS — W3400XA Accidental discharge from unspecified firearms or gun, initial encounter: Secondary | ICD-10-CM

## 2017-08-16 MED ORDER — DOXYCYCLINE HYCLATE 100 MG PO CAPS: 100.0000 mg | ORAL_CAPSULE | Freq: Two times a day (BID) | ORAL | 0 refills | Status: AC

## 2017-08-16 MED ORDER — ALBUTEROL SULFATE HFA 108 (90 BASE) MCG/ACT IN AERS: 1.0000 | INHALATION_SPRAY | Freq: Four times a day (QID) | RESPIRATORY_TRACT | 0 refills | Status: AC | PRN

## 2017-08-16 MED ORDER — IPRATROPIUM-ALBUTEROL 0.5-2.5 (3) MG/3ML IN SOLN
3.0000 mL | Freq: Once | RESPIRATORY_TRACT | Status: AC
  Administered 2017-08-16: 3 mL via RESPIRATORY_TRACT
  Filled 2017-08-16: qty 3

## 2017-08-16 NOTE — ED Triage Notes (Signed)
Second page for Ortho to call PA Vanessa BarbaraJeff H.

## 2017-08-16 NOTE — ED Notes (Signed)
Declined W/C at D/C and was escorted to lobby by RN. 
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
## 2017-08-16 NOTE — Discharge Instructions (Addendum)
Please read attached information. If you experience any new or worsening signs or symptoms please return to the emergency room for evaluation. Please follow-up with your primary care provider or specialist as discussed. Please use medication prescribed only as directed and discontinue taking if you have any concerning signs or symptoms.    Please remove splint daily for wound care. Please wash the wound with soap and water bandaging backup and applying Ace bandage with splint.
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
## 2017-08-16 NOTE — Progress Notes (Signed)
Orthopedic Tech Progress Note Patient Details:  Nathan GarretJamal Roberson 07-Feb-1995 [id number]  Ortho Devices Type of Ortho Device: Ace wrap, Volar splint Ortho Device/Splint Location: rue Ortho Device/Splint Interventions: Application   Post Interventions Patient Tolerated: Well Instructions Provided: Care of device   Nikki DomCrawford, [name] 08/16/2017, 5:40 PM

## 2018-06-17 ENCOUNTER — Other Ambulatory Visit: Payer: Self-pay

## 2018-06-17 ENCOUNTER — Emergency Department (HOSPITAL_COMMUNITY)
Admission: EM | Admit: 2018-06-17 | Discharge: 2018-06-17 | Disposition: A | Discharge status: Home / Self Care | Attending: Emergency Medicine | Admitting: Emergency Medicine

## 2018-06-17 ENCOUNTER — Encounter (HOSPITAL_COMMUNITY): Payer: Self-pay

## 2018-06-17 ENCOUNTER — Emergency Department (HOSPITAL_COMMUNITY)

## 2018-06-17 DIAGNOSIS — J45909 Unspecified asthma, uncomplicated: Secondary | ICD-10-CM | POA: Insufficient documentation

## 2018-06-17 DIAGNOSIS — Y999 Unspecified external cause status: Secondary | ICD-10-CM | POA: Diagnosis not present

## 2018-06-17 DIAGNOSIS — S61411A Laceration without foreign body of right hand, initial encounter: Secondary | ICD-10-CM | POA: Insufficient documentation

## 2018-06-17 DIAGNOSIS — Z23 Encounter for immunization: Secondary | ICD-10-CM | POA: Insufficient documentation

## 2018-06-17 DIAGNOSIS — Y92149 Unspecified place in prison as the place of occurrence of the external cause: Secondary | ICD-10-CM | POA: Diagnosis not present

## 2018-06-17 DIAGNOSIS — Y9389 Activity, other specified: Secondary | ICD-10-CM | POA: Insufficient documentation

## 2018-06-17 DIAGNOSIS — F17210 Nicotine dependence, cigarettes, uncomplicated: Secondary | ICD-10-CM | POA: Insufficient documentation

## 2018-06-17 DIAGNOSIS — W228XXA Striking against or struck by other objects, initial encounter: Secondary | ICD-10-CM | POA: Diagnosis not present

## 2018-06-17 DIAGNOSIS — S6991XA Unspecified injury of right wrist, hand and finger(s), initial encounter: Secondary | ICD-10-CM | POA: Diagnosis present

## 2018-06-17 MED ORDER — TETANUS-DIPHTH-ACELL PERTUSSIS 5-2.5-18.5 LF-MCG/0.5 IM SUSP
0.5000 mL | Freq: Once | INTRAMUSCULAR | Status: AC
  Administered 2018-06-17: 11:00:00 0.5 mL via INTRAMUSCULAR
  Filled 2018-06-17: qty 0.5

## 2018-06-17 MED ORDER — LIDOCAINE HCL (PF) 1 % IJ SOLN
5.0000 mL | Freq: Once | INTRAMUSCULAR | Status: AC
Start: 2018-06-17 — End: 2018-06-17
  Administered 2018-06-17: 11:00:00 5 mL
  Filled 2018-06-17: qty 30
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
## 2018-06-17 NOTE — Discharge Instructions (Signed)
You were seen in the ED today for a laceration to your hand; your xray was negative for any fractures; you will need to get the sutures removed in 1 weeks time; if you begin to notice any redness, swelling, drainage of pus, or if you develop fever/chills you will need to return to the ED for antibiotics or the nursing staff can place you on some

## 2018-06-17 NOTE — ED Notes (Signed)
Bed: WTR7 Expected date:  Expected time:  Means of arrival:  Comments: 

## 2021-05-04 ENCOUNTER — Other Ambulatory Visit: Payer: Self-pay

## 2021-05-04 ENCOUNTER — Encounter (HOSPITAL_COMMUNITY): Payer: Self-pay

## 2021-05-04 ENCOUNTER — Emergency Department (HOSPITAL_COMMUNITY)

## 2021-05-04 ENCOUNTER — Emergency Department (HOSPITAL_COMMUNITY)
Admission: EM | Admit: 2021-05-04 | Discharge: 2021-05-05 | Attending: Emergency Medicine | Admitting: Emergency Medicine

## 2021-05-04 DIAGNOSIS — Z23 Encounter for immunization: Secondary | ICD-10-CM | POA: Diagnosis not present

## 2021-05-04 DIAGNOSIS — R569 Unspecified convulsions: Secondary | ICD-10-CM | POA: Insufficient documentation

## 2021-05-04 DIAGNOSIS — S00531A Contusion of lip, initial encounter: Secondary | ICD-10-CM | POA: Diagnosis not present

## 2021-05-04 DIAGNOSIS — Y901 Blood alcohol level of 20-39 mg/100 ml: Secondary | ICD-10-CM | POA: Diagnosis not present

## 2021-05-04 DIAGNOSIS — S50312A Abrasion of left elbow, initial encounter: Secondary | ICD-10-CM | POA: Diagnosis not present

## 2021-05-04 DIAGNOSIS — S0990XA Unspecified injury of head, initial encounter: Secondary | ICD-10-CM | POA: Diagnosis present

## 2021-05-04 DIAGNOSIS — S80212A Abrasion, left knee, initial encounter: Secondary | ICD-10-CM | POA: Diagnosis not present

## 2021-05-04 DIAGNOSIS — S0083XA Contusion of other part of head, initial encounter: Secondary | ICD-10-CM | POA: Diagnosis not present

## 2021-05-04 LAB — CBC WITH DIFFERENTIAL/PLATELET
Abs Immature Granulocytes: 0.02 10*3/uL (ref 0.00–0.07)
Basophils Absolute: 0 10*3/uL (ref 0.0–0.1)
Basophils Relative: 0 %
Eosinophils Absolute: 0 10*3/uL (ref 0.0–0.5)
Eosinophils Relative: 1 %
HCT: 41.6 % (ref 39.0–52.0)
Hemoglobin: 14.3 g/dL (ref 13.0–17.0)
Immature Granulocytes: 0 %
Lymphocytes Relative: 25 %
Lymphs Abs: 1.6 10*3/uL (ref 0.7–4.0)
MCH: 30.8 pg (ref 26.0–34.0)
MCHC: 34.4 g/dL (ref 30.0–36.0)
MCV: 89.5 fL (ref 80.0–100.0)
Monocytes Absolute: 0.5 10*3/uL (ref 0.1–1.0)
Monocytes Relative: 9 %
Neutro Abs: 4.1 10*3/uL (ref 1.7–7.7)
Neutrophils Relative %: 65 %
Platelets: 247 10*3/uL (ref 150–400)
RBC: 4.65 MIL/uL (ref 4.22–5.81)
RDW: 12.4 % (ref 11.5–15.5)
WBC: 6.2 10*3/uL (ref 4.0–10.5)
nRBC: 0 % (ref 0.0–0.2)

## 2021-05-04 LAB — I-STAT CHEM 8, ED
BUN: 9 mg/dL (ref 6–20)
Calcium, Ion: 1.16 mmol/L (ref 1.15–1.40)
Chloride: 104 mmol/L (ref 98–111)
Creatinine, Ser: 1.4 mg/dL — ABNORMAL HIGH (ref 0.61–1.24)
Glucose, Bld: 75 mg/dL (ref 70–99)
HCT: 42 % (ref 39.0–52.0)
Hemoglobin: 14.3 g/dL (ref 13.0–17.0)
Potassium: 4.4 mmol/L (ref 3.5–5.1)
Sodium: 139 mmol/L (ref 135–145)
TCO2: 24 mmol/L (ref 22–32)

## 2021-05-04 LAB — BASIC METABOLIC PANEL
Anion gap: 16 — ABNORMAL HIGH (ref 5–15)
BUN: 8 mg/dL (ref 6–20)
CO2: 17 mmol/L — ABNORMAL LOW (ref 22–32)
Calcium: 9.4 mg/dL (ref 8.9–10.3)
Chloride: 105 mmol/L (ref 98–111)
Creatinine, Ser: 1.4 mg/dL — ABNORMAL HIGH (ref 0.61–1.24)
GFR, Estimated: >60 mL/min (ref >60)
Glucose, Bld: 87 mg/dL (ref 70–99)
Potassium: 3.8 mmol/L (ref 3.5–5.1)
Sodium: 138 mmol/L (ref 135–145)

## 2021-05-04 LAB — TYPE AND SCREEN
ABO/RH(D): O POS
Antibody Screen: NEGATIVE

## 2021-05-04 LAB — RAPID HIV SCREEN (HIV 1/2 AB+AG)
HIV 1/2 Antibodies: NONREACTIVE
HIV-1 P24 Antigen - HIV24: NONREACTIVE

## 2021-05-04 LAB — ABO/RH: ABO/RH(D): O POS

## 2021-05-04 LAB — ETHANOL: Alcohol, Ethyl (B): 20 mg/dL — ABNORMAL HIGH (ref <10)

## 2021-05-04 MED ORDER — TETANUS-DIPHTH-ACELL PERTUSSIS 5-2.5-18.5 LF-MCG/0.5 IM SUSY
0.5000 mL | PREFILLED_SYRINGE | Freq: Once | INTRAMUSCULAR | Status: AC
  Administered 2021-05-04: 0.5 mL via INTRAMUSCULAR
  Filled 2021-05-04: qty 0.5

## 2021-05-04 MED ORDER — SODIUM CHLORIDE 0.9 % IV BOLUS
1000.0000 mL | Freq: Once | INTRAVENOUS | Status: AC
  Administered 2021-05-04: 1000 mL via INTRAVENOUS

## 2021-05-04 MED ORDER — ACETAMINOPHEN 500 MG PO TABS
1000.0000 mg | ORAL_TABLET | Freq: Once | ORAL | Status: AC
  Administered 2021-05-04: 1000 mg via ORAL
  Filled 2021-05-04: qty 2
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
## 2021-05-04 NOTE — Discharge Instructions (Addendum)
Return for any problem. ? ?Use Ice, Tylenol, Motrin for treatment of pain. ?

## 2021-05-05 LAB — HEPATITIS PANEL, ACUTE
HCV Ab: NONREACTIVE
Hep A IgM: NONREACTIVE
Hep B C IgM: NONREACTIVE
Hepatitis B Surface Ag: NONREACTIVE

## 2021-05-05 NOTE — ED Notes (Signed)
Patient verbalizes understanding of discharge instructions. Opportunity for questioning and answers were provided. Armband removed by staff, pt discharged from ED.  
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
